# Patient Record
Sex: Female | Born: 1970 | Race: White | Hispanic: No | Marital: Single | Smoking: Never smoker
Health system: Southern US, Community
[De-identification: ages and names within clinical notes are randomized; demographics above are authoritative.]

## PROBLEM LIST (undated history)

## (undated) DIAGNOSIS — M1990 Unspecified osteoarthritis, unspecified site: Secondary | ICD-10-CM

## (undated) DIAGNOSIS — IMO0002 Reserved for concepts with insufficient information to code with codable children: Secondary | ICD-10-CM

## (undated) HISTORY — PX: TONSILLECTOMY: SUR1361

## (undated) HISTORY — PX: TUBAL LIGATION: SHX77

## (undated) HISTORY — PX: UPPER GI ENDOSCOPY: SHX6162

## (undated) HISTORY — PX: COLONOSCOPY: SHX174

---

## 2013-02-27 ENCOUNTER — Emergency Department (HOSPITAL_COMMUNITY): Payer: Non-veteran care

## 2013-02-27 ENCOUNTER — Encounter (HOSPITAL_COMMUNITY): Payer: Self-pay | Admitting: Emergency Medicine

## 2013-02-27 ENCOUNTER — Emergency Department (HOSPITAL_COMMUNITY)
Admission: EM | Admit: 2013-02-27 | Discharge: 2013-02-27 | Disposition: A | Discharge status: Home / Self Care | Payer: Non-veteran care | Attending: Emergency Medicine | Admitting: Emergency Medicine

## 2013-02-27 DIAGNOSIS — N83209 Unspecified ovarian cyst, unspecified side: Secondary | ICD-10-CM | POA: Insufficient documentation

## 2013-02-27 DIAGNOSIS — Z8739 Personal history of other diseases of the musculoskeletal system and connective tissue: Secondary | ICD-10-CM | POA: Insufficient documentation

## 2013-02-27 DIAGNOSIS — Z8719 Personal history of other diseases of the digestive system: Secondary | ICD-10-CM | POA: Insufficient documentation

## 2013-02-27 DIAGNOSIS — Z3202 Encounter for pregnancy test, result negative: Secondary | ICD-10-CM | POA: Insufficient documentation

## 2013-02-27 DIAGNOSIS — Z9889 Other specified postprocedural states: Secondary | ICD-10-CM | POA: Insufficient documentation

## 2013-02-27 DIAGNOSIS — Z9851 Tubal ligation status: Secondary | ICD-10-CM | POA: Insufficient documentation

## 2013-02-27 DIAGNOSIS — R110 Nausea: Secondary | ICD-10-CM | POA: Insufficient documentation

## 2013-02-27 HISTORY — DX: Reserved for concepts with insufficient information to code with codable children: IMO0002

## 2013-02-27 HISTORY — DX: Unspecified osteoarthritis, unspecified site: M19.90

## 2013-02-27 LAB — COMPREHENSIVE METABOLIC PANEL
AST: 14 U/L (ref 0–37)
Albumin: 3.7 g/dL (ref 3.5–5.2)
Alkaline Phosphatase: 59 U/L (ref 39–117)
CO2: 24 mEq/L (ref 19–32)
Chloride: 107 mEq/L (ref 96–112)
Creatinine, Ser: 0.77 mg/dL (ref 0.50–1.10)
GFR calc non Af Amer: >90 mL/min (ref >90)
Glucose, Bld: 98 mg/dL (ref 70–99)
Potassium: 4.1 mEq/L (ref 3.5–5.1)
Total Bilirubin: 0.2 mg/dL — ABNORMAL LOW (ref 0.3–1.2)

## 2013-02-27 LAB — URINALYSIS, ROUTINE W REFLEX MICROSCOPIC
Bilirubin Urine: NEGATIVE
Glucose, UA: NEGATIVE mg/dL
Hgb urine dipstick: NEGATIVE
Ketones, ur: NEGATIVE mg/dL
Nitrite: NEGATIVE
Protein, ur: NEGATIVE mg/dL
Urobilinogen, UA: 0.2 mg/dL (ref 0.0–1.0)

## 2013-02-27 LAB — WET PREP, GENITAL
Clue Cells Wet Prep HPF POC: NONE SEEN
Trich, Wet Prep: NONE SEEN

## 2013-02-27 LAB — CBC WITH DIFFERENTIAL/PLATELET
Basophils Absolute: 0 10*3/uL (ref 0.0–0.1)
Basophils Relative: 0 % (ref 0–1)
HCT: 36.7 % (ref 36.0–46.0)
Hemoglobin: 12.6 g/dL (ref 12.0–15.0)
Lymphocytes Relative: 27 % (ref 12–46)
Lymphs Abs: 1.9 10*3/uL (ref 0.7–4.0)
MCV: 86.6 fL (ref 78.0–100.0)
Monocytes Absolute: 0.6 10*3/uL (ref 0.1–1.0)
Monocytes Relative: 8 % (ref 3–12)
Neutro Abs: 4.2 10*3/uL (ref 1.7–7.7)
Neutrophils Relative %: 59 % (ref 43–77)
RDW: 14.7 % (ref 11.5–15.5)
WBC: 7.1 10*3/uL (ref 4.0–10.5)

## 2013-02-27 MED ORDER — OXYCODONE-ACETAMINOPHEN 5-325 MG PO TABS: 2.0000 | ORAL_TABLET | ORAL | Status: AC | PRN

## 2013-02-27 MED ORDER — IOHEXOL 300 MG/ML  SOLN
100.0000 mL | Freq: Once | INTRAMUSCULAR | Status: AC | PRN
  Administered 2013-02-27: 100 mL via INTRAVENOUS

## 2013-02-27 MED ORDER — ONDANSETRON HCL 4 MG/2ML IJ SOLN
4.0000 mg | Freq: Once | INTRAMUSCULAR | Status: AC
  Administered 2013-02-27: 4 mg via INTRAVENOUS
  Filled 2013-02-27: qty 2

## 2013-02-27 MED ORDER — HYDROMORPHONE HCL PF 1 MG/ML IJ SOLN
1.0000 mg | Freq: Once | INTRAMUSCULAR | Status: AC
  Administered 2013-02-27: 1 mg via INTRAVENOUS
  Filled 2013-02-27: qty 1

## 2013-02-27 MED ORDER — IBUPROFEN 800 MG PO TABS: 800.0000 mg | ORAL_TABLET | Freq: Three times a day (TID) | ORAL | Status: AC

## 2013-02-27 MED ORDER — IOHEXOL 300 MG/ML  SOLN
25.0000 mL | INTRAMUSCULAR | Status: AC
  Administered 2013-02-27: 25 mL via ORAL

## 2013-02-27 MED ORDER — SODIUM CHLORIDE 0.9 % IV SOLN
Freq: Once | INTRAVENOUS | Status: AC
  Administered 2013-02-27: 09:00:00 via INTRAVENOUS

## 2013-02-27 NOTE — ED Notes (Signed)
Call placed to CT, confirmed height and weight for pt and status of contrast solution.  CT technician in the room.

## 2013-02-27 NOTE — ED Provider Notes (Signed)
CSN: [id number]     Arrival date & time 02/27/13  4098 History   First MD Initiated Contact with Patient 02/27/13 [phone]     Chief Complaint  Patient presents with  . Flank Pain    Right   (Consider location/radiation/quality/duration/timing/severity/associated sxs/prior Treatment) Patient is a 42 y.o. female presenting with abdominal pain. The history is provided by the patient. No language interpreter was used.  Abdominal Pain Pain location:  LLQ and RLQ Pain quality: aching, cramping and sharp   Pain radiates to:  Does not radiate Pain severity:  Moderate Onset quality:  Sudden Duration:  1 hour Timing:  Constant Progression:  Unchanged Chronicity:  New Context: not alcohol use   Relieved by:  Nothing Worsened by:  Nothing tried Ineffective treatments:  None tried Associated symptoms: nausea   Associated symptoms: no constipation, no fever, no hematuria and no vomiting   Risk factors: no alcohol abuse   Pt complains of lower abdominal pain.  Pt reports she has a history of diverticulitis.   Pt is concerned that this could be her diverticulitis  Past Medical History  Diagnosis Date  . Arthritis    Past Surgical History  Procedure Laterality Date  . Colonoscopy    . Tonsillectomy    . Tubal ligation     History reviewed. No pertinent family history. History  Substance Use Topics  . Smoking status: Never Smoker   . Smokeless tobacco: Never Used  . Alcohol Use: No   OB History   Grav Para Term Preterm Abortions TAB SAB Ect Mult Living                 Review of Systems  Constitutional: Negative for fever.  Gastrointestinal: Positive for nausea and abdominal pain. Negative for vomiting and constipation.  Genitourinary: Negative for hematuria.  All other systems reviewed and are negative.    Allergies  Review of patient's allergies indicates no known allergies.  Home Medications  No current outpatient prescriptions on file. BP 149/78  Pulse 90  Temp(Src)  97.8 F (36.6 C) (Oral)  Resp 20  SpO2 99%  LMP 01/26/2013 Physical Exam  Nursing note and vitals reviewed. Constitutional: She is oriented to person, place, and time. She appears well-developed and well-nourished.  HENT:  Head: Normocephalic and atraumatic.  Right Ear: External ear normal.  Left Ear: External ear normal.  Eyes: Conjunctivae are normal. Pupils are equal, round, and reactive to light.  Neck: Normal range of motion. Neck supple.  Cardiovascular: Normal rate and normal heart sounds.   Pulmonary/Chest: Effort normal.  Abdominal: Soft.  Genitourinary: Vagina normal and uterus normal.  No adnexa nontenderness  Musculoskeletal: Normal range of motion.  Neurological: She is alert and oriented to person, place, and time. She has normal reflexes.  Skin: Skin is warm.  Psychiatric: She has a normal mood and affect.    ED Course  Procedures (including critical care time) Labs Review Labs Reviewed - No data to display Imaging Review No results found.  EKG Interpretation   None       MDM   1. Ovarian cyst     Ct scan shows ovarian cyst on right side.      Lonia Skinner [place], PA-C 02/27/13 [phone]

## 2013-02-27 NOTE — ED Notes (Signed)
Pelvic cart in the room.  

## 2013-02-27 NOTE — ED Notes (Signed)
CT made aware pt finished drinking first cup of contrast.

## 2013-02-27 NOTE — ED Notes (Signed)
Pt c/o of Right sided abdominal pain that started today.  Pt states this may be a flare-up from her diverticulitis.  Pt states pain is intermittent with a 5/10.

## 2013-02-27 NOTE — ED Notes (Signed)
Patient transported to CT 

## 2013-02-27 NOTE — ED Notes (Signed)
CT [facility] at bedside.

## 2013-02-27 NOTE — ED Notes (Signed)
Verlon Au, PA at the bedside.

## 2013-02-27 NOTE — ED Provider Notes (Signed)
Medical screening examination/treatment/procedure(s) were performed by non-physician practitioner and as supervising physician I was immediately available for consultation/collaboration.  EKG Interpretation   None         Shade Rivenbark H Kalup Jaquith, MD 02/27/13 1443 

## 2013-02-28 LAB — GC/CHLAMYDIA PROBE AMP
CT Probe RNA: NEGATIVE
GC Probe RNA: NEGATIVE

## 2013-06-04 ENCOUNTER — Encounter (HOSPITAL_COMMUNITY): Payer: Self-pay | Admitting: Emergency Medicine

## 2013-06-04 ENCOUNTER — Emergency Department (INDEPENDENT_AMBULATORY_CARE_PROVIDER_SITE_OTHER)
Admission: EM | Admit: 2013-06-04 | Discharge: 2013-06-04 | Disposition: A | Discharge status: Home / Self Care | Source: Home / Self Care

## 2013-06-04 ENCOUNTER — Emergency Department (INDEPENDENT_AMBULATORY_CARE_PROVIDER_SITE_OTHER)

## 2013-06-04 DIAGNOSIS — S300XXA Contusion of lower back and pelvis, initial encounter: Secondary | ICD-10-CM

## 2013-06-04 DIAGNOSIS — S7010XA Contusion of unspecified thigh, initial encounter: Secondary | ICD-10-CM

## 2013-06-04 DIAGNOSIS — W010XXA Fall on same level from slipping, tripping and stumbling without subsequent striking against object, initial encounter: Secondary | ICD-10-CM

## 2013-06-04 DIAGNOSIS — IMO0002 Reserved for concepts with insufficient information to code with codable children: Secondary | ICD-10-CM | POA: Diagnosis not present

## 2013-06-04 DIAGNOSIS — M549 Dorsalgia, unspecified: Secondary | ICD-10-CM

## 2013-06-04 DIAGNOSIS — W009XXA Unspecified fall due to ice and snow, initial encounter: Secondary | ICD-10-CM

## 2013-06-04 DIAGNOSIS — S20229A Contusion of unspecified back wall of thorax, initial encounter: Secondary | ICD-10-CM

## 2013-06-04 DIAGNOSIS — S43401A Unspecified sprain of right shoulder joint, initial encounter: Secondary | ICD-10-CM

## 2013-06-04 DIAGNOSIS — S46911A Strain of unspecified muscle, fascia and tendon at shoulder and upper arm level, right arm, initial encounter: Secondary | ICD-10-CM

## 2013-06-04 DIAGNOSIS — S7000XA Contusion of unspecified hip, initial encounter: Secondary | ICD-10-CM

## 2013-06-04 MED ORDER — TRAMADOL HCL 50 MG PO TABS: 50.0000 mg | ORAL_TABLET | Freq: Four times a day (QID) | ORAL | Status: AC | PRN

## 2013-06-04 NOTE — Discharge Instructions (Signed)
Back Pain, Adult Low back pain is very common. About 1 in 5 people have back pain.The cause of low back pain is rarely dangerous. The pain often gets better over time.About half of people with a sudden onset of back pain feel better in just 2 weeks. About 8 in 10 people feel better by 6 weeks.  CAUSES Some common causes of back pain include:  Strain of the muscles or ligaments supporting the spine.  Wear and tear (degeneration) of the spinal discs.  Arthritis.  Direct injury to the back. DIAGNOSIS Most of the time, the direct cause of low back pain is not known.However, back pain can be treated effectively even when the exact cause of the pain is unknown.Answering your caregiver's questions about your overall health and symptoms is one of the most accurate ways to make sure the cause of your pain is not dangerous. If your caregiver needs more information, he or she may order lab work or imaging tests (X-rays or MRIs).However, even if imaging tests show changes in your back, this usually does not require surgery. HOME CARE INSTRUCTIONS For many people, back pain returns.Since low back pain is rarely dangerous, it is often a condition that people can learn to [facility] their own.   Remain active. It is stressful on the back to sit or stand in one place. Do not sit, drive, or stand in one place for more than 30 minutes at a time. Take short walks on level surfaces as soon as pain allows.Try to increase the length of time you walk each day.  Do not stay in bed.Resting more than 1 or 2 days can delay your recovery.  Do not avoid exercise or work.Your body is made to move.It is not dangerous to be active, even though your back may hurt.Your back will likely heal faster if you return to being active before your pain is gone.  Pay attention to your body when you bend and lift. Many people have less discomfortwhen lifting if they bend their knees, keep the load close to their bodies,and  avoid twisting. Often, the most comfortable positions are those that put less stress on your recovering back.  Find a comfortable position to sleep. Use a firm mattress and lie on your side with your knees slightly bent. If you lie on your back, put a pillow under your knees.  Only take over-the-counter or prescription medicines as directed by your caregiver. Over-the-counter medicines to reduce pain and inflammation are often the most helpful.Your caregiver may prescribe muscle relaxant drugs.These medicines help dull your pain so you can more quickly return to your normal activities and healthy exercise.  Put ice on the injured area.  Put ice in a plastic bag.  Place a towel between your skin and the bag.  Leave the ice on for 15-20 minutes, 03-04 times a day for the first 2 to 3 days. After that, ice and heat may be alternated to reduce pain and spasms.  Ask your caregiver about trying back exercises and gentle massage. This may be of some benefit.  Avoid feeling anxious or stressed.Stress increases muscle tension and can worsen back pain.It is important to recognize when you are anxious or stressed and learn ways to manage it.Exercise is a great option. SEEK MEDICAL CARE IF:  You have pain that is not relieved with rest or medicine.  You have pain that does not improve in 1 week.  You have new symptoms.  You are generally not feeling well. SEEK  IMMEDIATE MEDICAL CARE IF:   You have pain that radiates from your back into your legs.  You develop new bowel or bladder control problems.  You have unusual weakness or numbness in your arms or legs.  You develop nausea or vomiting.  You develop abdominal pain.  You feel faint. Document Released: 03/31/2005 Document Revised: 09/30/2011 Document Reviewed: 08/19/2010 [facility] Patient Information 2014 [place], [place].  Contusion A contusion is a deep bruise. Contusions are the result of an injury that caused bleeding under the  skin. The contusion may turn blue, purple, or yellow. Minor injuries will give you a painless contusion, but more severe contusions may stay painful and swollen for a few weeks.  CAUSES  A contusion is usually caused by a blow, trauma, or direct force to an area of the body. SYMPTOMS   Swelling and redness of the injured area.  Bruising of the injured area.  Tenderness and soreness of the injured area.  Pain. DIAGNOSIS  The diagnosis can be made by taking a history and physical exam. An X-ray, CT scan, or MRI may be needed to determine if there were any associated injuries, such as fractures. TREATMENT  Specific treatment will depend on what area of the body was injured. In general, the best treatment for a contusion is resting, icing, elevating, and applying cold compresses to the injured area. Over-the-counter medicines may also be recommended for pain control. Ask your caregiver what the best treatment is for your contusion. HOME CARE INSTRUCTIONS   Put ice on the injured area.  Put ice in a plastic bag.  Place a towel between your skin and the bag.  Leave the ice on for 15-20 minutes, 03-04 times a day.  Only take over-the-counter or prescription medicines for pain, discomfort, or fever as directed by your caregiver. Your caregiver may recommend avoiding anti-inflammatory medicines (aspirin, ibuprofen, and naproxen) for 48 hours because these medicines may increase bruising.  Rest the injured area.  If possible, elevate the injured area to reduce swelling. SEEK IMMEDIATE MEDICAL CARE IF:   You have increased bruising or swelling.  You have pain that is getting worse.  Your swelling or pain is not relieved with medicines. MAKE SURE YOU:   Understand these instructions.  Will watch your condition.  Will get help right away if you are not doing well or get worse. Document Released: 01/08/2005 Document Revised: 06/23/2011 Document Reviewed: 02/03/2011 [facility] Patient  Information 2014 [place], [place].  Lumbosacral Strain Lumbosacral strain is a strain of any of the parts that make up your lumbosacral vertebrae. Your lumbosacral vertebrae are the bones that make up the lower third of your backbone. Your lumbosacral vertebrae are held together by muscles and tough, fibrous tissue (ligaments).  CAUSES  A sudden blow to your back can cause lumbosacral strain. Also, anything that causes an excessive stretch of the muscles in the low back can cause this strain. This is typically seen when people exert themselves strenuously, fall, lift heavy objects, bend, or crouch repeatedly. RISK FACTORS  Physically demanding work.  Participation in pushing or pulling sports or sports that require sudden twist of the back (tennis, golf, baseball).  Weight lifting.  Excessive lower back curvature.  Forward-tilted pelvis.  Weak back or abdominal muscles or both.  Tight hamstrings. SIGNS AND SYMPTOMS  Lumbosacral strain may cause pain in the area of your injury or pain that moves (radiates) down your leg.  DIAGNOSIS Your health care provider can often diagnose lumbosacral strain through a physical  exam. In some cases, you may need tests such as X-ray exams.  TREATMENT  Treatment for your lower back injury depends on many factors that your clinician will have to evaluate. However, most treatment will include the use of anti-inflammatory medicines. HOME CARE INSTRUCTIONS   Avoid hard physical activities (tennis, racquetball, waterskiing) if you are not in proper physical condition for it. This may aggravate or create problems.  If you have a back problem, avoid sports requiring sudden body movements. Swimming and walking are generally safer activities.  Maintain good posture.  Maintain a healthy weight.  For acute conditions, you may put ice on the injured area.  Put ice in a plastic bag.  Place a towel between your skin and the bag.  Leave the ice on for 20  minutes, 2 3 times a day.  When the low back starts healing, stretching and strengthening exercises may be recommended. SEEK MEDICAL CARE IF:  Your back pain is getting worse.  You experience severe back pain not relieved with medicines. SEEK IMMEDIATE MEDICAL CARE IF:   You have numbness, tingling, weakness, or problems with the use of your arms or legs.  There is a change in bowel or bladder control.  You have increasing pain in any area of the body, including your belly (abdomen).  You notice shortness of breath, dizziness, or feel faint.  You feel sick to your stomach (nauseous), are throwing up (vomiting), or become sweaty.  You notice discoloration of your toes or legs, or your feet get very cold. MAKE SURE YOU:   Understand these instructions.  Will watch your condition.  Will get help right away if you are not doing well or get worse. Document Released: 01/08/2005 Document Revised: 01/19/2013 Document Reviewed: 11/17/2012 [facility] Patient Information 2014 [place]Care, [place].  Shoulder Sprain A shoulder sprain is the result of damage to the tough, fiber-like tissues (ligaments) that help hold your shoulder in place. The ligaments may be stretched or torn. Besides the main shoulder joint (the ball and socket), there are several smaller joints that connect the bones in this area. A sprain usually involves one of those joints. Most often it is the acromioclavicular (or AC) joint. That is the joint that connects the collarbone (clavicle) and the shoulder blade (scapula) at the top point of the shoulder blade (acromion). A shoulder sprain is a mild form of what is called a shoulder separation. Recovering from a shoulder sprain may take some time. For some, pain lingers for several months. Most people recover without long term problems. CAUSES   A shoulder sprain is usually caused by some kind of trauma. This might be:  Falling on an outstretched arm.  Being hit hard on the  shoulder.  Twisting the arm.  Shoulder sprains are more likely to occur in people who:  Play sports.  Have balance or coordination problems. SYMPTOMS   Pain when you move your shoulder.  Limited ability to move the shoulder.  Swelling and tenderness on top of the shoulder.  Redness or warmth in the shoulder.  Bruising.  A change in the shape of the shoulder. DIAGNOSIS  Your healthcare provider may:  Ask about your symptoms.  Ask about recent activity that might have caused those symptoms.  Examine your shoulder. You may be asked to do simple exercises to test movement. The other shoulder will be examined for comparison.  Order some tests that provide a look inside the body. They can show the extent of the injury. The tests  could include:  X-rays.  CT (computed tomography) scan.  MRI (magnetic resonance imaging) scan. RISKS AND COMPLICATIONS  Loss of full shoulder motion.  Ongoing shoulder pain. TREATMENT  How long it takes to recover from a shoulder sprain depends on how severe it was. Treatment options may include:  Rest. You should not use the arm or shoulder until it heals.  Ice. For 2 or 3 days after the injury, put an ice pack on the shoulder up to 4 times a day. It should stay on for 15 to 20 minutes each time. Wrap the ice in a towel so it does not touch your skin.  Over-the-counter medicine to relieve pain.  A sling or brace. This will keep the arm still while the shoulder is healing.  Physical therapy or rehabilitation exercises. These will help you regain strength and motion. Ask your healthcare provider when it is OK to begin these exercises.  Surgery. The need for surgery is rare with a sprained shoulder, but some people may need surgery to keep the joint in place and reduce pain. HOME CARE INSTRUCTIONS   Ask your healthcare provider about what you should and should not do while your shoulder heals.  Make sure you know how to apply ice to the  correct area of your shoulder.  Talk with your healthcare provider about which medications should be used for pain and swelling.  If rehabilitation therapy will be needed, ask your healthcare provider to refer you to a therapist. If it is not recommended, then ask about at-home exercises. Find out when exercise should begin. SEEK MEDICAL CARE IF:  Your pain, swelling, or redness at the joint increases. SEEK IMMEDIATE MEDICAL CARE IF:   You have a fever.  You cannot move your arm or shoulder. Document Released: 08/17/2008 Document Revised: 06/23/2011 Document Reviewed: 08/17/2008 [facility] Patient Information 2014 [place], [place].

## 2013-06-04 NOTE — ED Provider Notes (Signed)
CSN: [id number]     Arrival date & time 06/04/13  1032 History   First MD Initiated Contact with Patient 06/04/13 1117     Chief Complaint  Patient presents with  . Back Pain  . Leg Pain     (Consider location/radiation/quality/duration/timing/severity/associated sxs/prior Treatment) HPI Comments: A 43 year old female states that she slipped on the ice and fell backwards on her buttocks and back. She is complaining of pain primarily in her lumbar spine and right paralumbar musculature. The pain to the back radiates down the buttock and lateral aspect of the right thigh. She is also complaining of pain in the right shoulder, trapezius and deltoid. She states she can walk but he takes 5 men to hold her up and assist her in walking. She denies striking her head, loss of consciousness or confusion. She is sitting in a wheelchair, fully alert, awake and oriented.   Past Medical History  Diagnosis Date  . Arthritis   . Degenerative disc disease    Past Surgical History  Procedure Laterality Date  . Colonoscopy    . Tonsillectomy    . Tubal ligation    . Upper gi endoscopy     History reviewed. No pertinent family history. History  Substance Use Topics  . Smoking status: Never Smoker   . Smokeless tobacco: Never Used  . Alcohol Use: No   OB History   Grav Para Term Preterm Abortions TAB SAB Ect Mult Living                 Review of Systems  Constitutional: Positive for activity change. Negative for fever and chills.  HENT: Negative.   Eyes: Negative.   Respiratory: Negative.   Cardiovascular: Negative.   Gastrointestinal: Negative.   Genitourinary: Negative.   Musculoskeletal: Positive for back pain and neck pain.       As per HPI  Skin: Negative for color change, pallor and rash.  Neurological: Positive for numbness. Negative for tremors, syncope, speech difficulty, weakness, light-headedness and headaches.       Numbness to right hand.      Allergies  [place] and  Pollen extract  Home Medications   Current Outpatient Rx  Name  Route  Sig  Dispense  Refill  . cetirizine (ZYRTEC) 10 MG tablet   Oral   Take 10 mg by mouth daily.         Marland Kitchen dicyclomine (BENTYL) 10 MG capsule   Oral   Take 10 mg by mouth 2 (two) times daily as needed for spasms.         Marland Kitchen ibuprofen (ADVIL,MOTRIN) 800 MG tablet   Oral   Take 1 tablet (800 mg total) by mouth 3 (three) times daily.   21 tablet   0   . naproxen (NAPROSYN) 250 MG tablet   Oral   Take 250 mg by mouth 2 (two) times daily with a meal.         . omeprazole (PRILOSEC) 20 MG capsule   Oral   Take 20 mg by mouth daily.         Marland Kitchen oxyCODONE-acetaminophen (PERCOCET/ROXICET) 5-325 MG per tablet   Oral   Take 2 tablets by mouth every 4 (four) hours as needed for severe pain.   16 tablet   0   . traMADol (ULTRAM) 50 MG tablet   Oral   Take 1 tablet (50 mg total) by mouth every 6 (six) hours as needed.   20 tablet   0  BP 135/88  Pulse 70  Temp(Src) 98.1 F (36.7 C) (Oral)  Resp 18  SpO2 98%  LMP 05/29/2013 Physical Exam  Nursing note and vitals reviewed. Constitutional: She is oriented to person, place, and time. She appears well-developed and well-nourished. No distress.  HENT:  Head: Normocephalic and atraumatic.  Eyes: Conjunctivae and EOM are normal.  Neck: Normal range of motion. Neck supple.  Cardiovascular: Normal rate, regular rhythm and normal heart sounds.   Pulmonary/Chest: Effort normal and breath sounds normal. No respiratory distress.  Musculoskeletal:  There is tenderness to the right para cervical musculature. No spinal swelling or palpable deformity. Range of motion is complete. Marked tenderness of the right trapezius distally along the ridge and continues over the scapula. She also complains of pain and tenderness in the right deltoid muscle. There is full range of motion of the right shoulder she has full external and internal rotation abilities abduction and  adduction is intact. Distal neurovascular motor sensory is intact. Right paralumbar musculature and lumbar spine tenderness. Tenderness to the right lateral hip and lateral thigh. There is mild tenderness to the medial aspect of the right knee. No swelling, tenderness, deformity or discoloration. She is able to stand and fully flex the right knee. She is able to abduct the right leg against resistance and without pain. When distracted she does not jump and grimace when palpating the right lateral thigh as she did on the initial evaluation. Distal neurovascular motor sensory is intact in the right lower extremity. No pain or tenderness to the abdomen, chest, left upper back or left extremities.  Lymphadenopathy:    She has no cervical adenopathy.  Neurological: She is alert and oriented to person, place, and time. No cranial nerve deficit.  Skin: Skin is warm and dry.  Psychiatric: She has a normal mood and affect.    ED Course  Procedures (including critical care time) Labs Review Labs Reviewed - No data to display Imaging Review Dg Lumbar Spine Complete  06/04/2013   CLINICAL DATA:  Fall, low back and right hip pain  EXAM: LUMBAR SPINE - COMPLETE 4+ VIEW  COMPARISON:  06/04/2013, 02/27/2013 CT reconstruction months  FINDINGS: There is no evidence of lumbar spine fracture. Alignment is normal. Intervertebral disc spaces are maintained.  IMPRESSION: No acute osseous finding   Electronically Signed   By: Ruel Favorsrevor  Shick M.D.   On: 06/04/2013 12:07   Dg Hip Complete Right  06/04/2013   CLINICAL DATA:  Fall, right hip pain  EXAM: RIGHT HIP - COMPLETE 2+ VIEW  COMPARISON:  None.  FINDINGS: There is no evidence of hip fracture or dislocation. There is no evidence of arthropathy or other focal bone abnormality.  IMPRESSION: No acute osseous finding.   Electronically Signed   By: Ruel Favorsrevor  Shick M.D.   On: 06/04/2013 12:06      MDM   Final diagnoses:  Contusion, hip and thigh  Lumbar contusion  Back  pain  Sprain and strain of right shoulder and upper arm  Fall from slipping on ice   Start out with cold compresses to the back followed by warm compresses. I continue taking the Naprosyn. Tramadol 50 mg every 6 hours when necessary pain #20 Stretches as demonstrated Instructions for injuries. Limit activities that exacerbate pain.    Hayden Rasmussenavid Aveah Castell, NP 06/04/13 1233

## 2013-06-04 NOTE — ED Notes (Signed)
C/o back pain and right leg and shoulder pain  States this morning she fell on a sheet of ice   States she took ibuprofen for relief.

## 2013-06-05 NOTE — ED Provider Notes (Signed)
Medical screening examination/treatment/procedure(s) were performed by non-physician practitioner and as supervising physician I was immediately available for consultation/collaboration.  Leslee Homeavid Evah Rashid, M.D.  Reuben Likesavid C Marquetta Weiskopf, MD 06/05/13 [phone]

## 2015-03-29 IMAGING — US US TRANSVAGINAL NON-OB
1 series · 13 of 25 positions shown · non-contrast
Comparison: CT same date

CLINICAL DATA: Right-sided pelvic pain.  Ovarian cyst on prior exam

EXAM:
TRANSABDOMINAL AND TRANSVAGINAL ULTRASOUND OF PELVIS
TECHNIQUE: Both transabdominal and transvaginal ultrasound examinations of the
pelvis were performed. Transabdominal technique was performed for
global imaging of the pelvis including uterus, ovaries, adnexal
regions, and pelvic cul-de-sac. It was necessary to proceed with
endovaginal exam following the transabdominal exam to visualize the
endometrium and ovaries.

[Series 1: us transvaginal non-ob · 0.22mm/px · 13 of 41 slices shown]
[im 1/41]
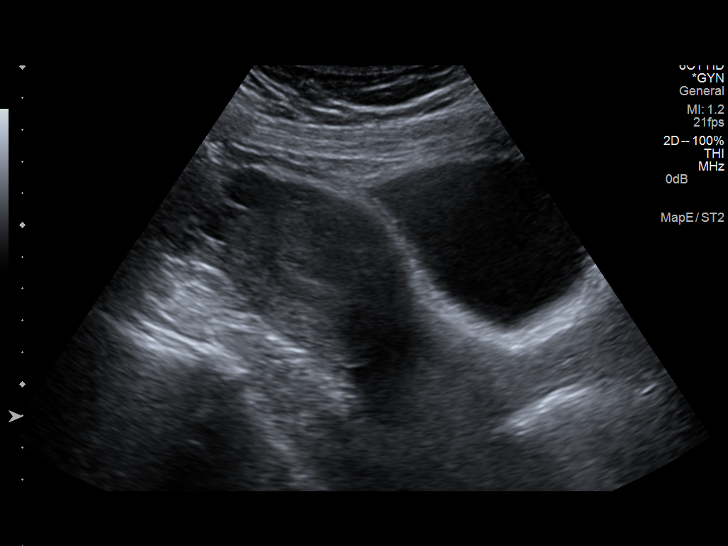
[im 4/41]
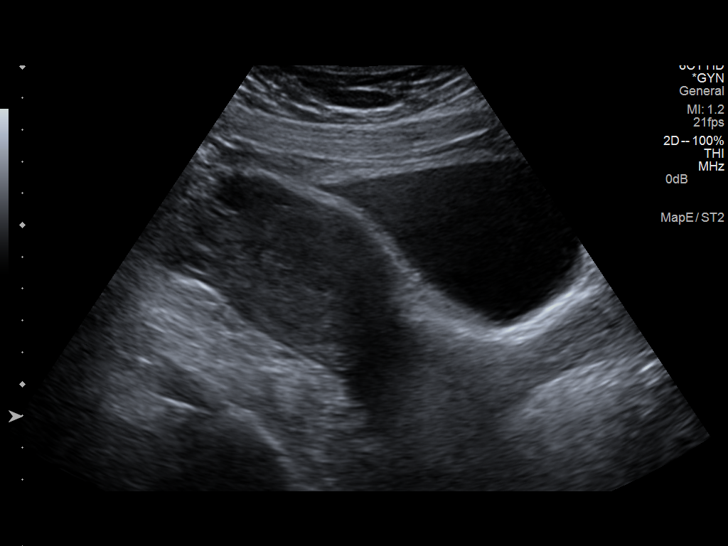
[im 7/41]
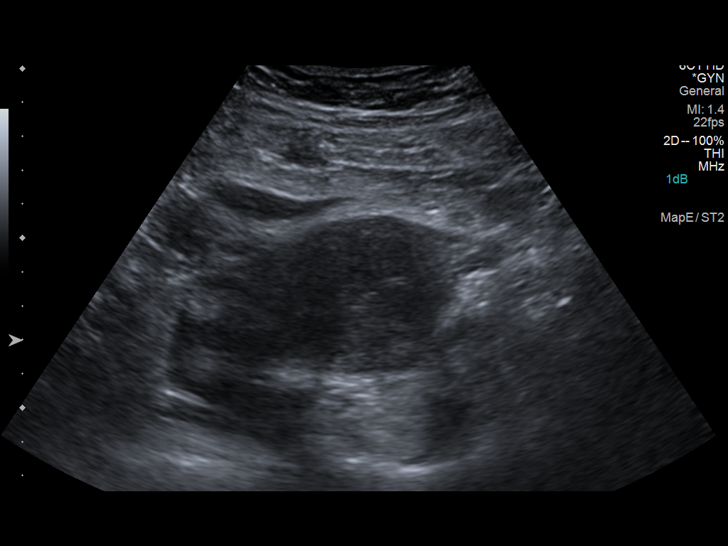
[im 11/41]
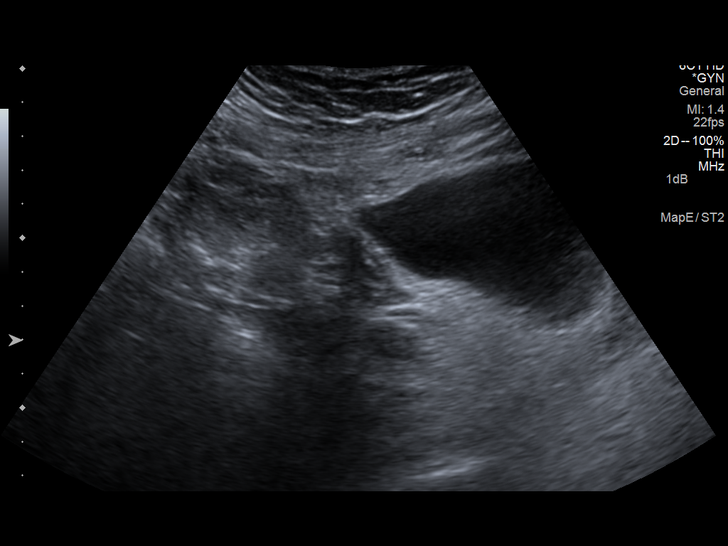
[im 14/41]
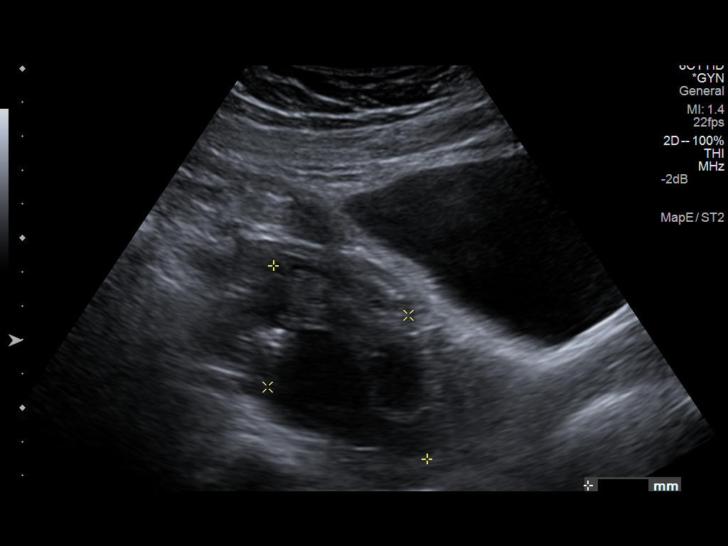
[im 17/41]
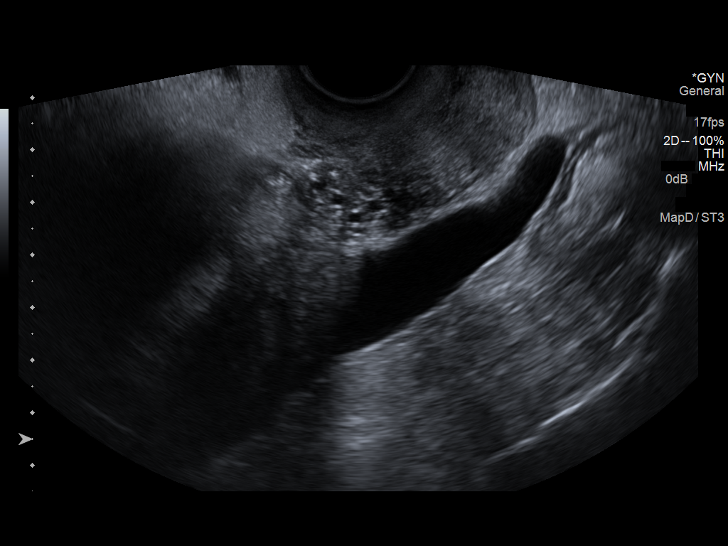
[im 21/41]
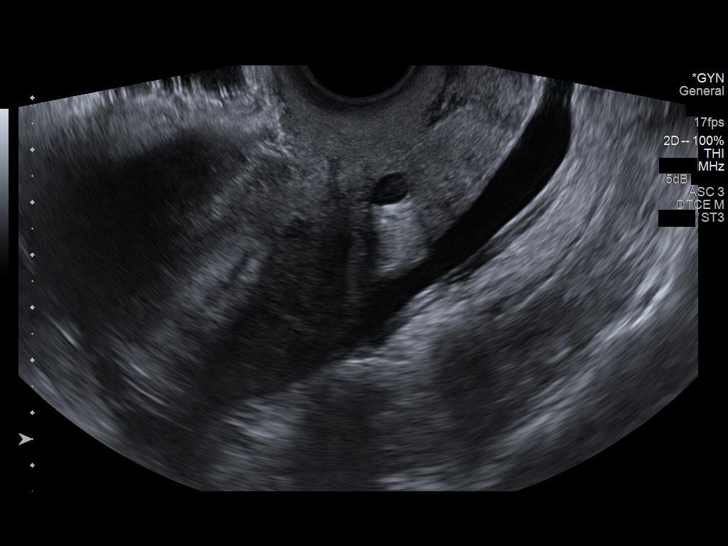
[im 24/41]
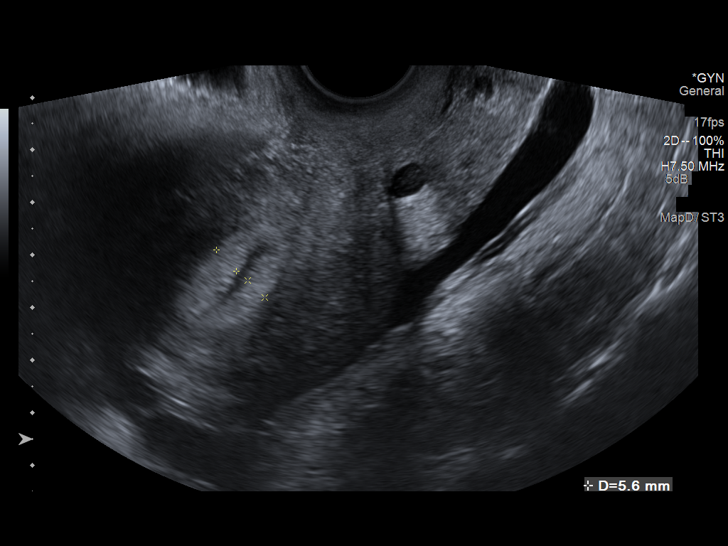
[im 27/41]
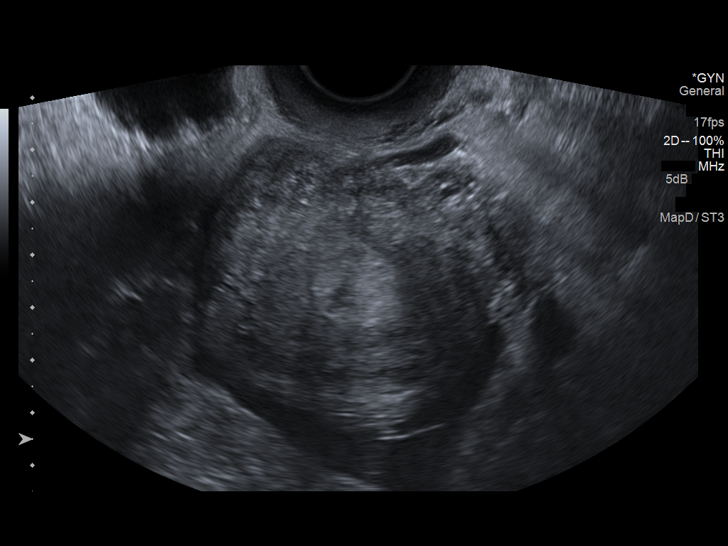
[im 31/41]
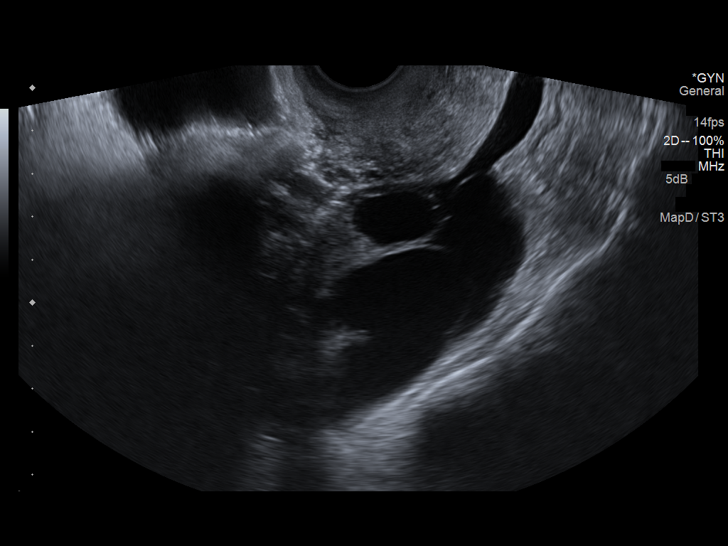
[im 34/41]
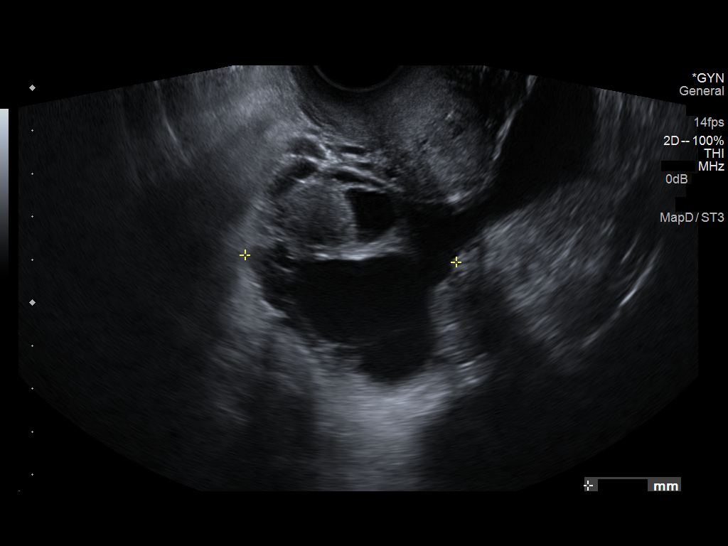
[im 37/41]
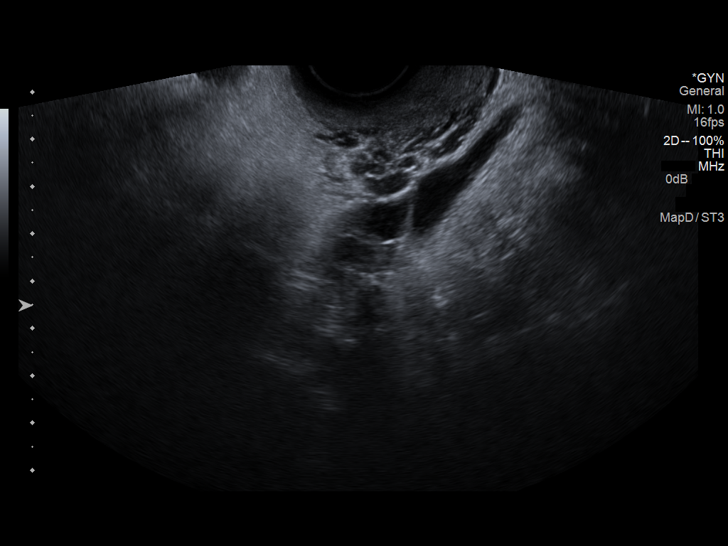
[im 41/41]
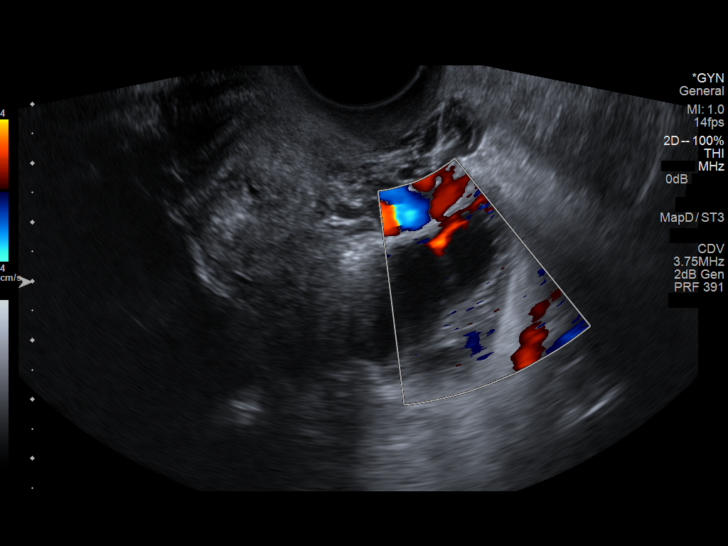

[13 of 25 positions shown; findings below may reference images not displayed]

FINDINGS: Uterus

Measurements: 9.6 x 5.6 x 4.7 cm.. Anteverted, anteflexed. No focal
abnormality.

Endometrium

Thickness: 1.1 cm.. Trace endometrial fluid is present without focal
endometrial abnormality.

Right ovary

Measurements: 7.4 x 4.9 x 4.3 cm. Multiple simple appearing cysts,
largest 4.0 x 3.0 x 2.8 cm.. Normal appearance/no adnexal mass.

Left ovary

Measurements: 3.7 x 2.1 x 2.0 cm. Normal appearance/no adnexal mass.

Other findings

Small free fluid noted, including fluid around the right ovary
IMPRESSION: Right ovarian cysts incidentally noted. Free fluid around the right
adnexa could indicate ovarian cyst rupture. No specific further
followup is needed for these benign appearing imaging findings in
the absence of continued clinical abnormality.

## 2015-07-04 IMAGING — CR DG LUMBAR SPINE COMPLETE 4+V
5 series · 5 of 5 positions shown · non-contrast
Comparison: 06/04/2013, 02/27/2013 CT reconstruction months

CLINICAL DATA: Fall, low back and right hip pain

EXAM:
LUMBAR SPINE - COMPLETE 4+ VIEW

[view not recorded (1 of 5)]
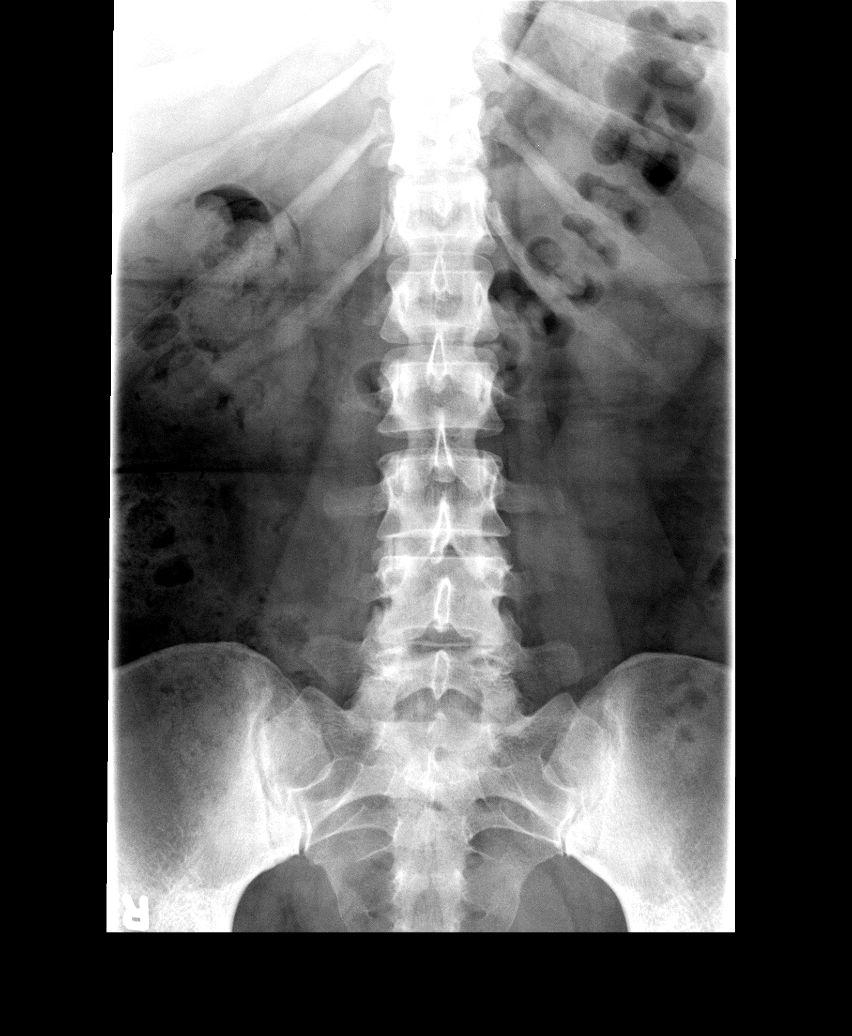

[view not recorded (2 of 5)]
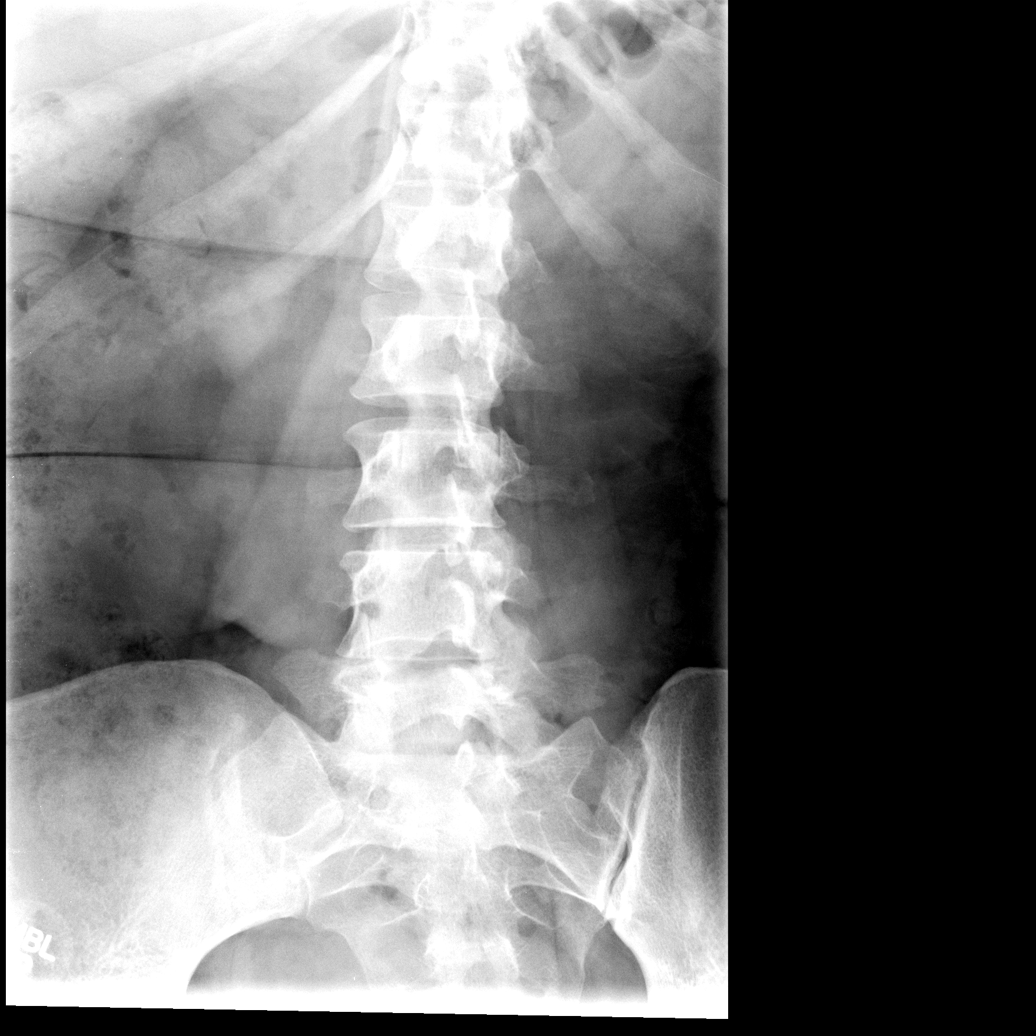

[view not recorded (3 of 5)]
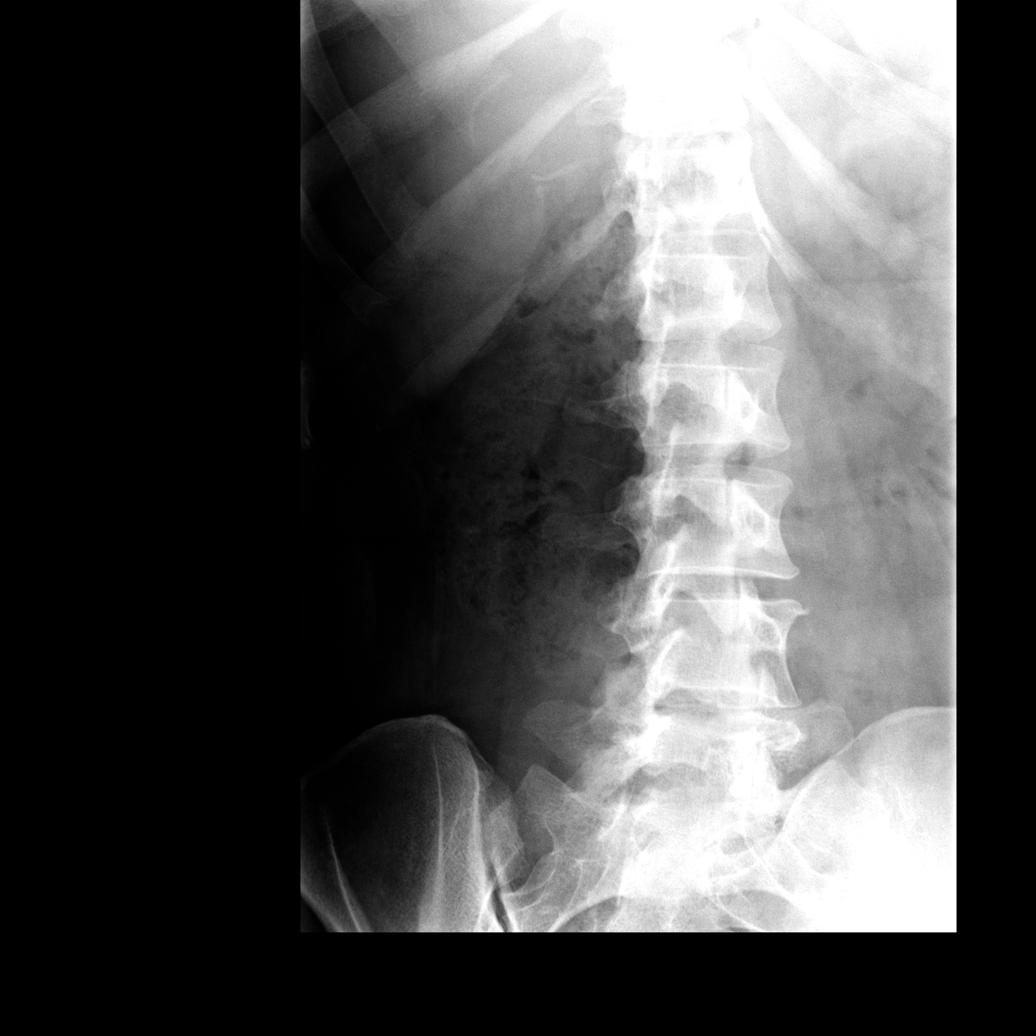

[view not recorded (4 of 5)]
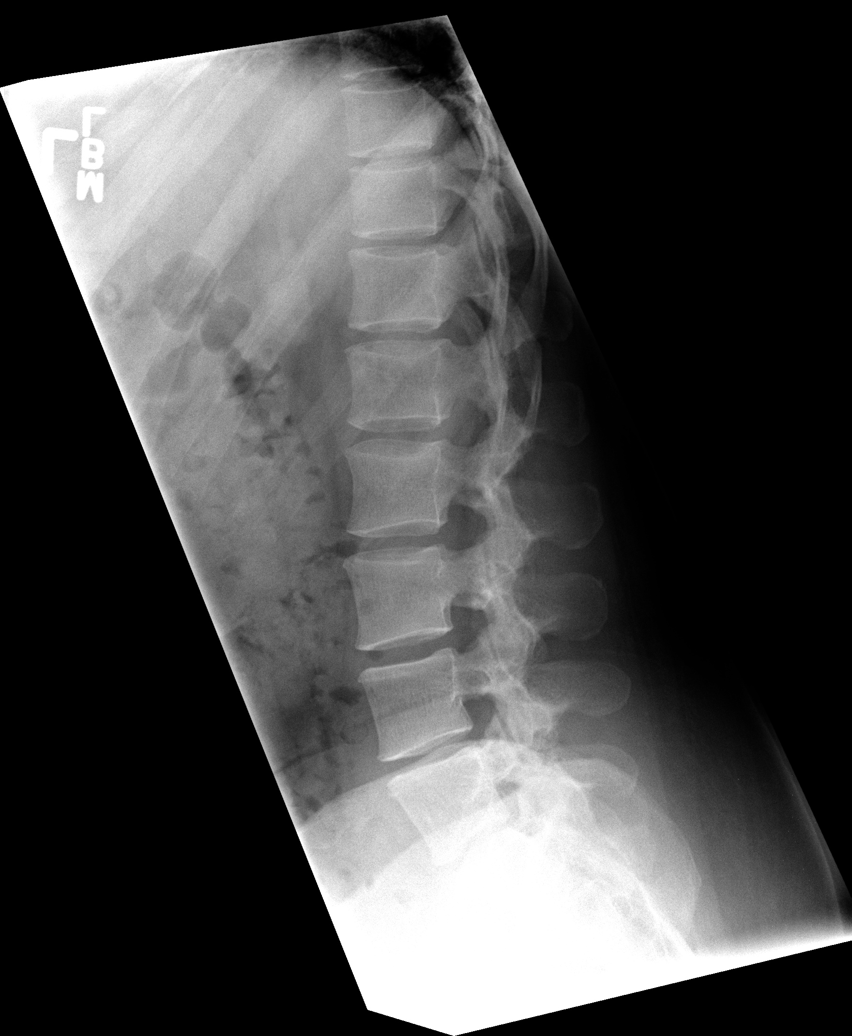

[view not recorded (5 of 5)]
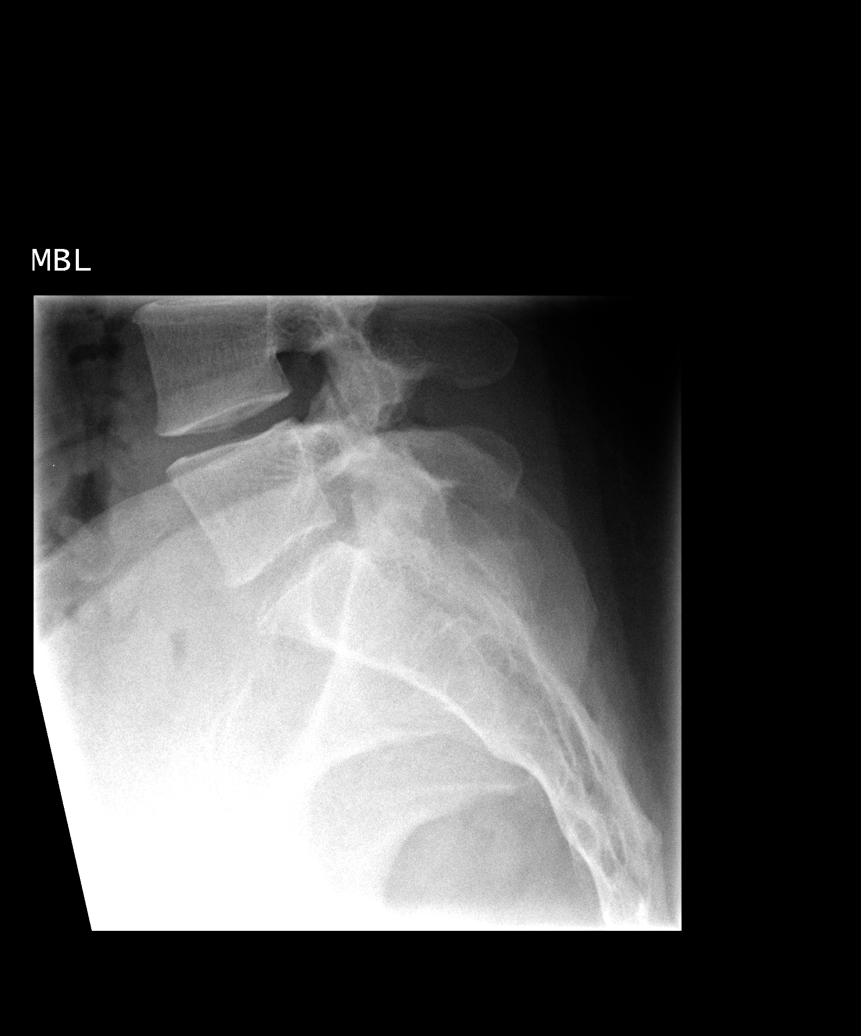

[5 of 5 positions shown; findings below may reference images not displayed]

FINDINGS: There is no evidence of lumbar spine fracture. Alignment is normal.
Intervertebral disc spaces are maintained.
IMPRESSION: No acute osseous finding
